# Patient Record
Sex: Female | Born: 1960 | Race: White | Hispanic: No | Marital: Married | Smoking: Never smoker
Health system: Southern US, Community
[De-identification: ages and names within clinical notes are randomized; demographics above are authoritative.]

## PROBLEM LIST (undated history)

## (undated) DIAGNOSIS — E280 Estrogen excess: Secondary | ICD-10-CM

## (undated) DIAGNOSIS — M069 Rheumatoid arthritis, unspecified: Secondary | ICD-10-CM

## (undated) DIAGNOSIS — C801 Malignant (primary) neoplasm, unspecified: Secondary | ICD-10-CM

## (undated) DIAGNOSIS — R4589 Other symptoms and signs involving emotional state: Secondary | ICD-10-CM

## (undated) DIAGNOSIS — N898 Other specified noninflammatory disorders of vagina: Secondary | ICD-10-CM

## (undated) HISTORY — DX: Other specified noninflammatory disorders of vagina: N89.8

## (undated) HISTORY — PX: NOVASURE ABLATION: SHX5394

## (undated) HISTORY — DX: Rheumatoid arthritis, unspecified: M06.9

## (undated) HISTORY — PX: LAPAROSCOPY: SHX197

## (undated) HISTORY — DX: Estrogen excess: E28.0

## (undated) HISTORY — PX: TONSILLECTOMY: SUR1361

## (undated) HISTORY — DX: Other symptoms and signs involving emotional state: R45.89

---

## 1998-04-06 ENCOUNTER — Other Ambulatory Visit: Admission: RE | Admit: 1998-04-06 | Discharge: 1998-04-06 | Payer: Self-pay | Admitting: Gynecology

## 1998-04-16 ENCOUNTER — Inpatient Hospital Stay (HOSPITAL_COMMUNITY): Admission: AD | Admit: 1998-04-16 | Discharge: 1998-04-19 | Payer: Self-pay | Admitting: Gynecology

## 1998-04-20 ENCOUNTER — Encounter: Admission: RE | Admit: 1998-04-20 | Discharge: 1998-07-19 | Payer: Self-pay | Admitting: *Deleted

## 1998-06-20 ENCOUNTER — Other Ambulatory Visit: Admission: RE | Admit: 1998-06-20 | Discharge: 1998-06-20 | Payer: Self-pay | Admitting: Obstetrics and Gynecology

## 1998-07-28 ENCOUNTER — Encounter (HOSPITAL_COMMUNITY): Admission: RE | Admit: 1998-07-28 | Discharge: 1998-10-26 | Payer: Self-pay | Admitting: *Deleted

## 2000-01-31 ENCOUNTER — Other Ambulatory Visit: Admission: RE | Admit: 2000-01-31 | Discharge: 2000-01-31 | Payer: Self-pay | Admitting: Gynecology

## 2004-10-30 ENCOUNTER — Ambulatory Visit: Payer: Self-pay

## 2005-11-22 ENCOUNTER — Ambulatory Visit: Payer: Self-pay

## 2005-12-04 ENCOUNTER — Ambulatory Visit: Payer: Self-pay

## 2006-04-09 ENCOUNTER — Ambulatory Visit: Payer: Self-pay | Admitting: Orthopaedic Surgery

## 2006-07-11 ENCOUNTER — Ambulatory Visit: Payer: Self-pay

## 2007-05-25 ENCOUNTER — Ambulatory Visit: Payer: Self-pay

## 2008-06-07 ENCOUNTER — Ambulatory Visit: Payer: Self-pay

## 2009-10-19 ENCOUNTER — Ambulatory Visit: Payer: Self-pay

## 2011-01-04 ENCOUNTER — Ambulatory Visit: Payer: Self-pay

## 2012-04-27 ENCOUNTER — Ambulatory Visit: Payer: Self-pay | Admitting: Gastroenterology

## 2012-05-05 ENCOUNTER — Ambulatory Visit: Payer: Self-pay | Admitting: Obstetrics and Gynecology

## 2013-10-22 ENCOUNTER — Ambulatory Visit: Payer: Self-pay

## 2014-03-25 DIAGNOSIS — K219 Gastro-esophageal reflux disease without esophagitis: Secondary | ICD-10-CM | POA: Insufficient documentation

## 2014-04-14 ENCOUNTER — Ambulatory Visit (INDEPENDENT_AMBULATORY_CARE_PROVIDER_SITE_OTHER): Payer: BC Managed Care – PPO

## 2014-04-14 ENCOUNTER — Encounter: Payer: Self-pay | Admitting: Podiatrist

## 2014-04-14 ENCOUNTER — Ambulatory Visit (INDEPENDENT_AMBULATORY_CARE_PROVIDER_SITE_OTHER): Payer: BC Managed Care – PPO | Admitting: Podiatrist

## 2014-04-14 VITALS — Resp 16 | Ht 64.0 in | Wt 126.0 lb

## 2014-04-14 DIAGNOSIS — M25879 Other specified joint disorders, unspecified ankle and foot: Secondary | ICD-10-CM

## 2014-04-14 DIAGNOSIS — M069 Rheumatoid arthritis, unspecified: Secondary | ICD-10-CM

## 2014-04-14 DIAGNOSIS — M79609 Pain in unspecified limb: Secondary | ICD-10-CM

## 2014-04-14 DIAGNOSIS — M79673 Pain in unspecified foot: Secondary | ICD-10-CM

## 2014-04-14 DIAGNOSIS — L600 Ingrowing nail: Secondary | ICD-10-CM

## 2014-04-14 NOTE — Patient Instructions (Signed)
Rheumatoid Arthritis Rheumatoid arthritis is a long-term (chronic) inflammatory disease that causes pain, swelling, and stiffness of the joints. It can affect the entire body, including the eyes and lungs. The effects of rheumatoid arthritis vary widely among those with the condition. CAUSES  The cause of rheumatoid arthritis is not known. It tends to run in families and is more common in women. Certain cells of the body's natural defense system (immune system) do not work properly and begin to attack healthy joints. It primarily involves the connective tissue that lines the joints (synovial membrane). This can cause damage to the joint. SYMPTOMS   Pain, stiffness, swelling, and decreased motion of many joints, especially in the hands and feet.  Stiffness that is worse in the morning. It may last 1 2 hours or longer.  Numbness and tingling in the hands.  Fatigue.  Loss of appetite.  Weight loss.  Low-grade fever.  Dry eyes and mouth.  Firm lumps (rheumatoid nodules) that grow beneath the skin in areas such as the elbows and hands. DIAGNOSIS  Diagnosis is based on the symptoms described, an exam, and blood tests. Sometimes, X-rays are helpful. TREATMENT  The goals of treatment are to relieve pain, reduce inflammation, and to slow down or stop joint damage and disability. Methods vary and may include:  Maintaining a balance of rest, exercise, and proper nutrition.  Medicines:  Pain relievers (analgesics).  Corticosteroids and nonsteroidal anti-inflammatory drugs (NSAIDs) to reduce inflammation.  Disease-modifying antirheumatic drugs (DMARDs) to try to slow the course of the disease.  Biologic response modifiers to reduce inflammation and damage.  Physical therapy and occupational therapy.  Surgery for patients with severe joint damage. Joint replacement or fusing of joints may be needed.  Routine monitoring and ongoing care, such as office visits, blood and urine tests, and  X-rays. HOME CARE INSTRUCTIONS   Remain physically active and reduce activity when the disease gets worse.  Eat a well-balanced diet.  Put heat on affected joints when you wake up and before activities. Keep the heat on the affected joint for as long as directed by your caregiver.  Put ice on affected joints following activities or exercising.  Put ice in a plastic bag.  Place a towel between your skin and the bag.  Leave the ice on for 15-20 minutes, 03-04 times a day.  Take all medicines and supplements as directed by your caregiver.  Use splints as directed by your caregiver. Splints help maintain joint position and function.  Do not sleep with pillows under your knees. This may lead to spasms.  Participate in a self-management program to keep current with the latest treatment and coping skills. SEEK IMMEDIATE MEDICAL CARE IF:  You have fainting episodes.  You have periods of extreme weakness.  You rapidly develop a hot, painful joint that is more severe than usual joint aches.  You have chills.  You have a fever. MAKE SURE YOU:  Understand these instructions.  Will watch your condition.  Will get help right away if you are not doing well or get worse. FOR MORE INFORMATION  American College of Rheumatology: www.rheumatology.[place]: www.arthritis.org Document Released: 12/06/2000 Document Revised: 06/09/2012 Document Reviewed: 01/15/2012 [facility] Patient Information 2014 [place], [place].

## 2014-04-14 NOTE — Progress Notes (Signed)
   Subjective:    Patient ID: Kendra Collier, female    DOB: [birth year], 53 y.o.   MRN: [id number]  HPI Comments: i have problems with both feet, its the bunion area, b/l 5th digits, my ankles and look at my toenails. [name] been having these problems for about 4 yrs. They have gotten worse over the years. i have had 3 cortisone injections by dr Precious Reel and wear different shoes. i went to see an orthopedic in [place] 2.5 yrs ago and he gave me injections for metatarsalitis.   Foot Pain Associated symptoms include fatigue.   Patient has also recently been diagnosed with Lyme disease and has not yet started treatment.  She has Enbrel for her RA and also has not started this medication due to the recent Lyme disease diagnosis   Review of Systems  Constitutional: Positive for fatigue.       Sweating Weight change   HENT: Negative.   Eyes: Positive for redness.  Respiratory: Negative.   Cardiovascular: Negative.   Gastrointestinal: Negative.   Endocrine: Positive for cold intolerance and heat intolerance.  Genitourinary: Positive for urgency and frequency.  Musculoskeletal:       Joint pain Back pain   Skin: Negative.   Allergic/Immunologic: Positive for environmental allergies.  Neurological: Negative.   Hematological: Negative.   Psychiatric/Behavioral: Negative.        Objective:   Physical Exam  GENERAL APPEARANCE: Alert, conversant. Appropriately groomed. No acute distress.  VASCULAR: Pedal pulses palpable at 2/4 DP and PT bilateral.  Capillary refill time is immediate to all digits,  Proximal to distal cooling it warm to warm.  Digital hair growth is present bilateral  NEUROLOGIC: sensation is intact epicritically and protectively to 5.07 monofilament at 5/5 sites bilateral.  Light touch is intact bilateral, vibratory sensation intact bilateral, achilles tendon reflex is intact bilateral.  MUSCULOSKELETAL:prominent metatarsals 1 and 5 bilateral with discomfort  laterally at the 5th.  Rectus alignment of digits is present.  No pre-dislocation noted at mpj's.   DERMATOLOGIC: prominent cyst is present lateral side of the 5th metatarsal left foot.  Otherwise skin color, texture, and turger are within normal limits.  No preulcerative lesions are seen, no interdigital maceration noted.  No open lesions present.  Digital nail on the right hallux has an ingrown medial nail border-- today is not bothersome.       Assessment & Plan:  Cyst of skin left 5th metatarsal head, rheumatoid arthritis, Plan:  Discussed treatment options-- xrays were reviewed and showed excellent appearance of joints and joint spacing.  Her 5th metatarsal head on the right has mild degredation laterally.  This is the only abnormal finding.  Discussed a custom orthotic to accomidate her feet if her good brooks shoes become uncomfortable.  Discussed otc gel pads for her dress shoes.  Discussed permanent removal of the medial border of the right hallux nail in the future.  Discussed drainage or removal of the cyst on the left foot if it becomes painful.  She will call if she would like any or all of the suggested treatments.

## 2014-04-28 ENCOUNTER — Ambulatory Visit: Payer: Self-pay | Admitting: Podiatrist

## 2014-07-07 ENCOUNTER — Ambulatory Visit (INDEPENDENT_AMBULATORY_CARE_PROVIDER_SITE_OTHER): Payer: BC Managed Care – PPO | Admitting: Podiatrist

## 2014-07-07 ENCOUNTER — Encounter: Payer: Self-pay | Admitting: Podiatrist

## 2014-07-07 VITALS — BP 137/86 | HR 90 | Resp 12

## 2014-07-07 DIAGNOSIS — M069 Rheumatoid arthritis, unspecified: Secondary | ICD-10-CM

## 2014-07-07 DIAGNOSIS — M722 Plantar fascial fibromatosis: Secondary | ICD-10-CM

## 2014-07-07 DIAGNOSIS — M25879 Other specified joint disorders, unspecified ankle and foot: Secondary | ICD-10-CM

## 2014-07-07 NOTE — Progress Notes (Signed)
Subjective: Patient presents today for continued foot pain. She is on multiple medications for her rheumatoid arthritis and her husband has multiple sclerosis and recently had a fall so she's been helping him out lately and has noticed her feet have become more and more uncomfortable. She has diffuse pain on the first metatarsophalangeal joint right and plantar fasciitis symptomatology bilateral. She's been stretching the feet wearing good supportive shoes and has had injections in the past which have failed to alleviate her pain. She is on multiple medications for the inflammation and wonders if there is anything else she can try. She's also developed a cyst on the left foot that she wants checked out  Objective: Neurovascular status is unchanged mild pain on palpation plantar medial aspect of bilateral heels and along the plantar fascia is noted. A very small skin cyst is present on the instep of the left foot it is not red, is not inflamed, it appears to be within the dermal layer and not within the plantar fascia. No swelling within the plantar fascia is palpated. Generalized tightening of the structures is noted. Diffuse pain at the first metatarsophalangeal joint right is also noted  Assessment: Rheumatoid arthritis with plantar fasciitis, skin cyst left instep, capsulitis first metatarsal phalangeal joint right  Plan: Because of the multiple medications that she start he on for inflammation and the fact that the anti-inflammatory injections have failed to relieve her discomfort in the past I recommended a course of physical therapy. Recommended iontophoresis to be employed as well as range of motion and stretching exercises and ultrasound. Discussed I can inject the cyst however it's very small and at this point I prefer to see if ultrasound can be of benefit she will see me back after the completion of physical therapy for recheck and possible injection therapy at that time.

## 2014-07-07 NOTE — Patient Instructions (Signed)

## 2014-11-04 ENCOUNTER — Ambulatory Visit: Payer: Self-pay | Admitting: Obstetrics and Gynecology

## 2015-02-15 ENCOUNTER — Ambulatory Visit: Payer: Self-pay | Admitting: Unknown Physician Specialty

## 2015-04-17 LAB — SURGICAL PATHOLOGY

## 2015-10-25 DIAGNOSIS — E559 Vitamin D deficiency, unspecified: Secondary | ICD-10-CM | POA: Insufficient documentation

## 2016-01-03 ENCOUNTER — Encounter: Payer: Self-pay | Admitting: *Deleted

## 2016-01-05 ENCOUNTER — Encounter: Payer: Self-pay | Admitting: Obstetrics and Gynecology

## 2016-01-05 ENCOUNTER — Ambulatory Visit (INDEPENDENT_AMBULATORY_CARE_PROVIDER_SITE_OTHER): Payer: BLUE CROSS/BLUE SHIELD | Admitting: Obstetrics and Gynecology

## 2016-01-05 VITALS — BP 121/70 | HR 82 | Ht 64.0 in | Wt 135.4 lb

## 2016-01-05 DIAGNOSIS — R300 Dysuria: Secondary | ICD-10-CM

## 2016-01-05 DIAGNOSIS — N926 Irregular menstruation, unspecified: Secondary | ICD-10-CM | POA: Diagnosis not present

## 2016-01-05 LAB — POCT URINALYSIS DIPSTICK
Blood, UA: NEGATIVE
GLUCOSE UA: NEGATIVE
Ketones, UA: NEGATIVE
LEUKOCYTES UA: NEGATIVE
Nitrite, UA: NEGATIVE
Protein, UA: NEGATIVE
SPEC GRAV UA: 1.015
Urobilinogen, UA: 0.2
pH, UA: 6

## 2016-01-05 NOTE — Patient Instructions (Signed)
Interstitial Cystitis Interstitial cystitis is a condition that causes inflammation of the bladder. The bladder is a hollow organ in the lower part of your abdomen. It stores urine after the urine is made by your kidneys. With interstitial cystitis, you may have pain in the bladder area. You may also have a frequent and urgent need to urinate. The severity of interstitial cystitis can vary from person to person. You may have flare-ups of the condition, and then it may go away for a while. For many people who have this condition, it becomes a long-term problem. CAUSES The cause of this condition is not known. RISK FACTORS This condition is more likely to develop in women. SYMPTOMS Symptoms of interstitial cystitis vary, and they can change over time. Symptoms may include:  Discomfort or pain in the bladder area. This can range from mild to severe. The pain may change in intensity as the bladder fills with urine or as it empties.  Pelvic pain.  An urgent need to urinate.  Frequent urination.  Pain during sexual intercourse.  Pinpoint bleeding on the bladder wall. For women, the symptoms often get worse during menstruation. DIAGNOSIS This condition is diagnosed by evaluating your symptoms and ruling out other causes. A physical exam will be done. Various tests may be done to rule out other conditions. Common tests include:  Urine tests.  Cystoscopy. In this test, a tool that is like a very thin telescope is used to look into your bladder.  Biopsy. This involves taking a sample of tissue from the bladder wall to be examined under a microscope. TREATMENT There is no cure for interstitial cystitis, but treatment methods are available to control your symptoms. Work closely with your health care provider to find the treatments that will be most effective for you. Treatment options may include:  Medicines to relieve pain and to help reduce the number of times that you feel the need to  urinate.  Bladder training. This involves learning ways to control when you urinate, such as:  Urinating at scheduled times.  Training yourself to delay urination.  Doing exercises (Kegel exercises) to strengthen the muscles that control urine flow.  Lifestyle changes, such as changing your diet or taking steps to control stress.  Use of a device that provides electrical stimulation in order to reduce pain.  A procedure that stretches your bladder by filling it with air or fluid.  Surgery. This is rare. It is only done for extreme cases if other treatments do not help. HOME CARE INSTRUCTIONS  Take medicines only as directed by your health care provider.  Use bladder training techniques as directed.  Keep a bladder diary to find out which foods, liquids, or activities make your symptoms worse.  Use your bladder diary to schedule bathroom trips. If you are away from home, plan to be near a bathroom at each of your scheduled times.  Make sure you urinate just before you leave the house and just before you go to bed.  Do Kegel exercises as directed by your health care provider.  Do not drink alcohol.  Do not use any tobacco products, including cigarettes, chewing tobacco, or electronic cigarettes. If you need help quitting, ask your health care provider.  Make dietary changes as directed by your health care provider. You may need to avoid spicy foods and foods that contain a high amount of potassium.  Limit your drinking of beverages that stimulate urination. These include soda, coffee, and tea.  Keep all follow-up   visits as directed by your health care provider. This is important. SEEK MEDICAL CARE IF:  Your symptoms do not get better after treatment.  Your pain and discomfort are getting worse.  You have more frequent urges to urinate.  You have a fever. SEEK IMMEDIATE MEDICAL CARE IF:  You are not able to control your bladder at all.   This information is not  intended to replace advice given to you by your health care provider. Make sure you discuss any questions you have with your health care provider.   Document Released: 08/09/2004 Document Revised: 12/30/2014 Document Reviewed: 08/16/2014 Elsevier Interactive Patient Education 2016 Elsevier Inc.  

## 2016-01-05 NOTE — Progress Notes (Signed)
Subjective:     Patient ID: Kendra Collier, female   DOB: 1961/05/16, 55 y.o.   MRN: [id number]  HPI Recurrent bladder pressure and urinary frequency with decreased urinary output, first noted last summer and more noticeable last few weeks, seeing Dr Sharol Roussel (naturalist physician in [place]) for RA and recurrent yeast.  Review of Systems See above Not sexually active due to spouses advancing MS No menses in 2 month     Objective:   Physical Exam A&O x4 well groomed female in no distress Pelvic exam: normal external genitalia, vulva, vagina, cervix, uterus and adnexa.    Assessment:     Urinary frequency and pelvic pressure RA Missed menses x 2 months     Plan:     Urine sent for culture interstitual cystitis discussed  RTC as needed.  Melody [place], CNM

## 2016-01-07 LAB — URINE CULTURE: Organism ID, Bacteria: NO GROWTH

## 2016-01-10 ENCOUNTER — Telehealth: Payer: Self-pay | Admitting: *Deleted

## 2016-01-10 NOTE — Telephone Encounter (Signed)
-----   Message from Joylene Igo, [place] sent at 01/09/2016  9:06 AM EST ----- Please let her know

## 2016-01-10 NOTE — Telephone Encounter (Signed)
Left pt detailed message about her labs

## 2016-05-14 ENCOUNTER — Encounter: Payer: Self-pay | Admitting: Obstetrics and Gynecology

## 2016-05-18 ENCOUNTER — Encounter: Payer: Self-pay | Admitting: Emergency Medicine

## 2016-05-18 ENCOUNTER — Emergency Department: Payer: BLUE CROSS/BLUE SHIELD

## 2016-05-18 ENCOUNTER — Emergency Department
Admission: EM | Admit: 2016-05-18 | Discharge: 2016-05-18 | Disposition: A | Discharge status: Home / Self Care | Payer: BLUE CROSS/BLUE SHIELD | Attending: Emergency Medicine | Admitting: Emergency Medicine

## 2016-05-18 DIAGNOSIS — Z791 Long term (current) use of non-steroidal anti-inflammatories (NSAID): Secondary | ICD-10-CM | POA: Diagnosis not present

## 2016-05-18 DIAGNOSIS — R0789 Other chest pain: Secondary | ICD-10-CM | POA: Insufficient documentation

## 2016-05-18 DIAGNOSIS — M069 Rheumatoid arthritis, unspecified: Secondary | ICD-10-CM | POA: Insufficient documentation

## 2016-05-18 DIAGNOSIS — R079 Chest pain, unspecified: Secondary | ICD-10-CM

## 2016-05-18 LAB — BASIC METABOLIC PANEL
Anion gap: 8 (ref 5–15)
BUN: 15 mg/dL (ref 6–20)
CHLORIDE: 107 mmol/L (ref 101–111)
CO2: 26 mmol/L (ref 22–32)
CREATININE: 0.61 mg/dL (ref 0.44–1.00)
Calcium: 8.9 mg/dL (ref 8.9–10.3)
GFR calc Af Amer: >60 mL/min (ref >60)
GFR calc non Af Amer: >60 mL/min (ref >60)
Glucose, Bld: 85 mg/dL (ref 65–99)
Potassium: 3.4 mmol/L — ABNORMAL LOW (ref 3.5–5.1)
SODIUM: 141 mmol/L (ref 135–145)

## 2016-05-18 LAB — CBC
HCT: 39.5 % (ref 35.0–47.0)
Hemoglobin: 13.5 g/dL (ref 12.0–16.0)
MCH: 31.7 pg (ref 26.0–34.0)
MCHC: 34.2 g/dL (ref 32.0–36.0)
MCV: 92.7 fL (ref 80.0–100.0)
PLATELETS: 222 10*3/uL (ref 150–440)
RBC: 4.26 MIL/uL (ref 3.80–5.20)
RDW: 12.4 % (ref 11.5–14.5)
WBC: 10.4 10*3/uL (ref 3.6–11.0)

## 2016-05-18 LAB — TROPONIN I: Troponin I: <0.03 ng/mL (ref <0.031)

## 2016-05-18 NOTE — Discharge Instructions (Signed)
You were evaluated for chest discomfort, and although no certain cause was found, and your exam and evaluation are reassuring emergency department today.  Return to emergency department for any new or worsening condition including worsening chest pain, trouble breathing, fever, confusion altered mental status, dizziness or passing out, or any other symptoms concerning to you.   Nonspecific Chest Pain It is often hard to find the cause of chest pain. There is always a chance that your pain could be related to something serious, such as a heart attack or a blood clot in your lungs. Chest pain can also be caused by conditions that are not life-threatening. If you have chest pain, it is very important to follow up with your doctor.  HOME CARE  If you were prescribed an antibiotic medicine, finish it all even if you start to feel better.  Avoid any activities that cause chest pain.  Do not use any tobacco products, including cigarettes, chewing tobacco, or electronic cigarettes. If you need help quitting, ask your doctor.  Do not drink alcohol.  Take medicines only as told by your doctor.  Keep all follow-up visits as told by your doctor. This is important. This includes any further testing if your chest pain does not go away.  Your doctor may tell you to keep your head raised (elevated) while you sleep.  Make lifestyle changes as told by your doctor. These may include:  Getting regular exercise. Ask your doctor to suggest some activities that are safe for you.  Eating a heart-healthy diet. Your doctor or a diet specialist (dietitian) can help you to learn healthy eating options.  Maintaining a healthy weight.  Managing diabetes, if necessary.  Reducing stress. GET HELP IF:  Your chest pain does not go away, even after treatment.  You have a rash with blisters on your chest.  You have a fever. GET HELP RIGHT AWAY IF:  Your chest pain is worse.  You have an increasing cough,  or you cough up blood.  You have severe belly (abdominal) pain.  You feel extremely weak.  You pass out (faint).  You have chills.  You have sudden, unexplained chest discomfort.  You have sudden, unexplained discomfort in your arms, back, neck, or jaw.  You have shortness of breath at any time.  You suddenly start to sweat, or your skin gets clammy.  You feel nauseous.  You vomit.  You suddenly feel light-headed or dizzy.  Your heart begins to beat quickly, or it feels like it is skipping beats. These symptoms may be an emergency. Do not wait to see if the symptoms will go away. Get medical help right away. Call your local emergency services (911 in the [place] Do not drive yourself to the hospital.   This information is not intended to replace advice given to you by your health care provider. Make sure you discuss any questions you have with your health care provider.   Document Released: 05/27/2008 Document Revised: 12/30/2014 Document Reviewed: 07/15/2014 Elsevier Interactive Patient Education [employer].

## 2016-05-18 NOTE — ED Notes (Signed)
Pt took 2 325mg  asa this am

## 2016-05-18 NOTE — ED Notes (Signed)
Central chest pain yesterday that was relieved with 2 asa; today has heaviness in her chest.

## 2016-05-18 NOTE — ED Provider Notes (Signed)
[facility] Emergency Department Provider Note   ____________________________________________  Time seen: Approximately 1 PM I have reviewed the triage vital signs and the triage nursing note.  HISTORY  Chief Complaint Chest Pain   Historian Patient  HPI Kendra Collier is a 55 y.o. female with a history of rheumatoid arthritis, is here for evaluation of central chest pain that she noticed yesterday morning, and took 650 of aspirin and the pain subsided over the course of the day. She woke up this morning with a chest heaviness/chest pressure. No trouble breathing or shortness breath. No palpations. No pleuritic chest pain. No overuse injury. She does report that she's been under a lot of stress lately with her father being treated for pancreatic cancer.  Some previous problems with indigestion, but this episode doesn't really seem all that GI related.    Past Medical History  Diagnosis Date  . Rheumatoid arthritis ([place])   . RA (rheumatoid arthritis) ([place])   . Vaginal dryness   . Estrogen excess   . Depressed affect     There are no active problems to display for this patient.   Past Surgical History  Procedure Laterality Date  . Tonsillectomy    . Novasure ablation    . Laparoscopy      Current Outpatient Rx  Name  Route  Sig  Dispense  Refill  . ALPRAZolam (XANAX) 0.25 MG tablet               . folic acid (FOLVITE) 1 MG tablet      Reported on 01/05/2016         . Ibuprofen (ADVIL PO)   Oral   Take by mouth as needed.         . leflunomide (ARAVA) 20 MG tablet   Oral   Take 20 mg by mouth daily.         . predniSONE (STERAPRED UNI-PAK) 5 MG TABS tablet               . Vitamin D, Ergocalciferol, (DRISDOL) 50000 UNITS CAPS capsule                 Allergies Sulfa antibiotics  History reviewed. No pertinent family history.  Social History Social History  Substance Use Topics  . Smoking status: Never Smoker    . Smokeless tobacco: None  . Alcohol Use: Yes     Comment: occas    Review of Systems  Constitutional: Negative for fever. Eyes: Negative for visual changes. ENT: Negative for sore throat. Cardiovascular: Chest pressure as per history of present illness. Respiratory: Negative for shortness of breath. Gastrointestinal: Negative for abdominal pain, vomiting and diarrhea. Genitourinary: Negative for dysuria. Musculoskeletal: Negative for back pain. Skin: Negative for rash. Neurological: Negative for headache. 10 point Review of Systems otherwise negative ____________________________________________   PHYSICAL EXAM:  VITAL SIGNS: ED Triage Vitals  Enc Vitals Group     BP 05/18/16 1233 131/64 mmHg     Pulse Rate 05/18/16 1233 78     Resp 05/18/16 1233 18     Temp 05/18/16 1233 98.2 F (36.8 C)     Temp Source 05/18/16 1233 Oral     SpO2 05/18/16 1233 100 %     Weight 05/18/16 1233 132 lb (59.875 kg)     Height 05/18/16 1233 5\' 4"  (1.626 m)     Head Cir --      Peak Flow --      Pain Score 05/18/16  1235 2     Pain Loc --      Pain Edu? --      Excl. in [place]? --      Constitutional: Alert and oriented. Well appearing and in no distress. HEENT   Head: Normocephalic and atraumatic.      Eyes: Conjunctivae are normal. PERRL. Normal extraocular movements.      Ears:         Nose: No congestion/rhinnorhea.   Mouth/Throat: Mucous membranes are moist.   Neck: No stridor. Cardiovascular/Chest: Normal rate, regular rhythm.  No murmurs, rubs, or gallops. Respiratory: Normal respiratory effort without tachypnea nor retractions. Breath sounds are clear and equal bilaterally. No wheezes/rales/rhonchi. Gastrointestinal: Soft. No distention, no guarding, no rebound. Nontender.   Genitourinary/rectal:Deferred Musculoskeletal: Nontender with normal range of motion in all extremities. No joint effusions.  No lower extremity tenderness.  No edema. Neurologic:  Normal speech  and language. No gross or focal neurologic deficits are appreciated. Skin:  Skin is warm, dry and intact. No rash noted. Psychiatric: Mood and affect are normal. Speech and behavior are normal. Patient exhibits appropriate insight and judgment.  ____________________________________________   EKG I, Lisa Roca, MD, the attending physician have personally viewed and interpreted all ECGs.  77 bpm. normal sinus rhythm. Narrow QRS. Normal axis. Normal ST and T-wave ____________________________________________  LABS (pertinent positives/negatives)  Labs Reviewed  BASIC METABOLIC PANEL - Abnormal; Notable for the following:    Potassium 3.4 (*)    All other components within normal limits  CBC  TROPONIN I    ____________________________________________  RADIOLOGY All Xrays were viewed by me. Imaging interpreted by Radiologist.  Chest two-view: No active cardiopulmonary disease. __________________________________________  PROCEDURES  Procedure(s) performed: None  Critical Care performed: None  ____________________________________________   ED COURSE / ASSESSMENT AND PLAN  Pertinent labs & imaging results that were available during my care of the patient were reviewed by me and considered in my medical decision making (see chart for details).   She is here with an atypical chest discomfort, and her examiner evaluation are reassuring today in emergency room.  Although she does have a history of rheumatoid arthritis, she does not have any prior history of coronary artery disease, or prior history of follow-up with a cardiologist.  I will go ahead and refer her for outpatient evaluation with on-call unassigned cardiologist, Dr. Humphrey Rolls.  I'm not highly suspicious for acute coronary syndrome, or acute pulmonary or vascular emergency.    CONSULTATIONS:   None  Patient / Family / Caregiver informed of clinical course, medical decision-making process, and agree with plan.    I discussed return precautions, follow-up instructions, and discharged instructions with patient and/or family.   ___________________________________________   FINAL CLINICAL IMPRESSION(S) / ED DIAGNOSES   Final diagnoses:  Chest pain, unspecified chest pain type              Note: This dictation was prepared with Dragon dictation. Any transcriptional errors that result from this process are unintentional   Lisa Roca, MD 05/18/16 [phone]

## 2016-11-06 DIAGNOSIS — Z8619 Personal history of other infectious and parasitic diseases: Secondary | ICD-10-CM | POA: Insufficient documentation

## 2017-03-14 ENCOUNTER — Telehealth: Payer: Self-pay | Admitting: *Deleted

## 2017-03-14 ENCOUNTER — Other Ambulatory Visit: Payer: BLUE CROSS/BLUE SHIELD

## 2017-03-14 NOTE — Telephone Encounter (Signed)
pls advise

## 2017-03-14 NOTE — Telephone Encounter (Signed)
Have her drop off a urine 

## 2017-03-14 NOTE — Telephone Encounter (Signed)
Patient called and stated that she still thinks she has a vaginal yeast infection. Patient states she used the 7 day over the counter treatment but it seem to have not worked well. She is having lower back pain, a wet sensation in her vaginal area , also an ammonia odor coming from her vaginal area as well. She states if she can be called in something to her pharmacy. Her pharmacy is [facility] on [address] Her contact number is [phone]. Please advise. Thank you .

## 2017-05-13 ENCOUNTER — Other Ambulatory Visit: Payer: Self-pay | Admitting: Obstetrics and Gynecology

## 2017-05-13 ENCOUNTER — Encounter: Payer: Self-pay | Admitting: Obstetrics and Gynecology

## 2017-05-13 ENCOUNTER — Ambulatory Visit (INDEPENDENT_AMBULATORY_CARE_PROVIDER_SITE_OTHER): Payer: BLUE CROSS/BLUE SHIELD | Admitting: Obstetrics and Gynecology

## 2017-05-13 VITALS — BP 115/74 | HR 73 | Ht 64.0 in | Wt 129.6 lb

## 2017-05-13 DIAGNOSIS — R232 Flushing: Secondary | ICD-10-CM | POA: Diagnosis not present

## 2017-05-13 DIAGNOSIS — Z01419 Encounter for gynecological examination (general) (routine) without abnormal findings: Secondary | ICD-10-CM | POA: Diagnosis not present

## 2017-05-13 DIAGNOSIS — E559 Vitamin D deficiency, unspecified: Secondary | ICD-10-CM | POA: Diagnosis not present

## 2017-05-13 DIAGNOSIS — M069 Rheumatoid arthritis, unspecified: Secondary | ICD-10-CM | POA: Insufficient documentation

## 2017-05-13 NOTE — Progress Notes (Signed)
Subjective:   Kendra Collier is a 56 y.o. [id number] Caucasian female here for a routine well-woman exam.  No LMP recorded. Patient has had an ablation.    Current complaints: daily hot flashes and skin changes for the last few months. PCP: Sabra Heck       does desire labs  Social History: Sexual: heterosexual Marital Status: married Living situation: with family Occupation: unknown occupation Tobacco/alcohol: no tobacco use Illicit drugs: no history of illicit drug use  The following portions of the patient's history were reviewed and updated as appropriate: allergies, current medications, past family history, past medical history, past social history, past surgical history and problem list.  Past Medical History Past Medical History:  Diagnosis Date  . Depressed affect   . Estrogen excess   . RA (rheumatoid arthritis) ([place])   . Rheumatoid arthritis ([place])   . Vaginal dryness     Past Surgical History Past Surgical History:  Procedure Laterality Date  . LAPAROSCOPY    . NOVASURE ABLATION    . TONSILLECTOMY      Gynecologic History [id number]  No LMP recorded. Patient has had an ablation. Contraception: tubal ligation Last Pap: ?Marland Kitchen Results were: normal Last mammogram: 2015. Results were: normal  Obstetric History OB History  Gravida Para Term Preterm AB Living  5       3 2   SAB TAB Ectopic Multiple Live Births    3     2    # Outcome Date GA Lbr Len/2nd Weight Sex Delivery Anes PTL Lv  5 Gravida 2001     Vag-Spont  Y LIV  4 Gravida 1999    M Vag-Spont   LIV  3 TAB           2 TAB           1 TAB               Current Medications Current Outpatient Prescriptions on File Prior to Visit  Medication Sig Dispense Refill  . ALPRAZolam (XANAX) 0.25 MG tablet     . folic acid (FOLVITE) 1 MG tablet Reported on 01/05/2016    . Vitamin D, Ergocalciferol, (DRISDOL) 50000 UNITS CAPS capsule     . Ibuprofen (ADVIL PO) Take by mouth as needed.     No current  facility-administered medications on file prior to visit.     Review of Systems Patient denies any headaches, blurred vision, shortness of breath, chest pain, abdominal pain, problems with bowel movements, urination, or intercourse.  Objective:  BP 115/74   Pulse 73   Ht 5\' 4"  (1.626 m)   Wt 129 lb 9.6 oz (58.8 kg)   BMI 22.25 kg/m  Physical Exam  General:  Well developed, well nourished, no acute distress. She is alert and oriented x3. Skin:  Warm and dry Neck:  Midline trachea, no thyromegaly or nodules Cardiovascular: Regular rate and rhythm, no murmur heard Lungs:  Effort normal, all lung fields clear to auscultation bilaterally Breasts:  No dominant palpable mass, retraction, or nipple discharge Abdomen:  Soft, non tender, no hepatosplenomegaly or masses Pelvic:  External genitalia is normal in appearance.  The vagina is normal in appearance. The cervix is bulbous, no CMT.  Thin prep pap is done with HR HPV cotesting. Uterus is felt to be normal size, shape, and contour.  No adnexal masses or tenderness noted. Extremities:  No swelling or varicosities noted Psych:  She has a normal mood and affect  Assessment:   Healthy  well-woman exam Vitamin d deficiency RA Hot flashes S/p ablation 7 years ago  Plan:  Labs obtained- will follow up accordingly Discussed common signs of menopause and treatment options/risk/side-effects. F/U 1 year for AE, or sooner if needed Mammogram ordered  Jocelyne Reinertsen Rockney Ghee, CNM

## 2017-05-14 LAB — COMPREHENSIVE METABOLIC PANEL
ALBUMIN: 4.6 g/dL (ref 3.5–5.5)
ALT: 18 IU/L (ref 0–32)
AST: 18 IU/L (ref 0–40)
Albumin/Globulin Ratio: 2 (ref 1.2–2.2)
Alkaline Phosphatase: 60 IU/L (ref 39–117)
BUN/Creatinine Ratio: 25 — ABNORMAL HIGH (ref 9–23)
BUN: 20 mg/dL (ref 6–24)
Bilirubin Total: 0.2 mg/dL (ref 0.0–1.2)
CALCIUM: 9.7 mg/dL (ref 8.7–10.2)
CHLORIDE: 104 mmol/L (ref 96–106)
CO2: 25 mmol/L (ref 18–29)
CREATININE: 0.79 mg/dL (ref 0.57–1.00)
GFR calc Af Amer: 99 mL/min/{1.73_m2} (ref >59)
GFR calc non Af Amer: 86 mL/min/{1.73_m2} (ref >59)
GLUCOSE: 80 mg/dL (ref 65–99)
Globulin, Total: 2.3 g/dL (ref 1.5–4.5)
Potassium: 4 mmol/L (ref 3.5–5.2)
Sodium: 145 mmol/L — ABNORMAL HIGH (ref 134–144)
Total Protein: 6.9 g/dL (ref 6.0–8.5)

## 2017-05-14 LAB — THYROID PANEL WITH TSH
FREE THYROXINE INDEX: 2.2 (ref 1.2–4.9)
T3 Uptake Ratio: 32 % (ref 24–39)
T4 TOTAL: 6.8 ug/dL (ref 4.5–12.0)
TSH: 2.04 u[IU]/mL (ref 0.450–4.500)

## 2017-05-14 LAB — PROGESTERONE: Progesterone: 0.3 ng/mL

## 2017-05-14 LAB — CBC
HEMOGLOBIN: 13.5 g/dL (ref 11.1–15.9)
Hematocrit: 40.8 % (ref 34.0–46.6)
MCH: 30.8 pg (ref 26.6–33.0)
MCHC: 33.1 g/dL (ref 31.5–35.7)
MCV: 93 fL (ref 79–97)
PLATELETS: 230 10*3/uL (ref 150–379)
RBC: 4.38 x10E6/uL (ref 3.77–5.28)
RDW: 13.2 % (ref 12.3–15.4)
WBC: 8.1 10*3/uL (ref 3.4–10.8)

## 2017-05-14 LAB — FSH/LH
FSH: 67 m[IU]/mL
LH: 35.9 m[IU]/mL

## 2017-05-14 LAB — LIPID PANEL
CHOL/HDL RATIO: 2.9 ratio (ref 0.0–4.4)
CHOLESTEROL TOTAL: 217 mg/dL — AB (ref 100–199)
HDL: 75 mg/dL (ref >39)
LDL CALC: 121 mg/dL — AB (ref 0–99)
Triglycerides: 107 mg/dL (ref 0–149)
VLDL CHOLESTEROL CAL: 21 mg/dL (ref 5–40)

## 2017-05-14 LAB — ESTRADIOL: Estradiol: <5 pg/mL

## 2017-05-14 LAB — VITAMIN D 25 HYDROXY (VIT D DEFICIENCY, FRACTURES): VIT D 25 HYDROXY: 43.4 ng/mL (ref 30.0–100.0)

## 2017-05-15 LAB — CYTOLOGY - PAP

## 2017-06-13 ENCOUNTER — Ambulatory Visit
Admission: RE | Admit: 2017-06-13 | Discharge: 2017-06-13 | Disposition: A | Discharge status: Home / Self Care | Payer: BLUE CROSS/BLUE SHIELD | Source: Ambulatory Visit | Attending: Obstetrics and Gynecology | Admitting: Obstetrics and Gynecology

## 2017-06-13 DIAGNOSIS — Z01419 Encounter for gynecological examination (general) (routine) without abnormal findings: Secondary | ICD-10-CM

## 2017-06-13 DIAGNOSIS — Z1231 Encounter for screening mammogram for malignant neoplasm of breast: Secondary | ICD-10-CM | POA: Diagnosis not present

## 2017-06-13 HISTORY — DX: Malignant (primary) neoplasm, unspecified: C80.1

## 2017-07-25 ENCOUNTER — Encounter: Payer: Self-pay | Admitting: Obstetrics and Gynecology

## 2017-08-27 ENCOUNTER — Ambulatory Visit (INDEPENDENT_AMBULATORY_CARE_PROVIDER_SITE_OTHER): Payer: BLUE CROSS/BLUE SHIELD | Admitting: Obstetrics and Gynecology

## 2017-08-27 ENCOUNTER — Encounter: Payer: Self-pay | Admitting: Obstetrics and Gynecology

## 2017-08-27 VITALS — BP 104/70 | HR 74 | Ht 64.0 in | Wt 128.1 lb

## 2017-08-27 DIAGNOSIS — Z79890 Hormone replacement therapy (postmenopausal): Secondary | ICD-10-CM | POA: Diagnosis not present

## 2017-08-27 MED ORDER — ESTRADIOL-NORETHINDRONE ACET 0.05-0.25 MG/DAY TD PTTW: 1.0000 | MEDICATED_PATCH | TRANSDERMAL | 12 refills | Status: AC

## 2017-08-27 NOTE — Progress Notes (Signed)
Subjective:     Patient ID: Kendra Collier, female   DOB: 10-31-1961, 56 y.o.   MRN: [id number]  HPI Seen for AE in May and we discussed HRT at that time.  Still having frequent night sweats and hot flashes. Doesn't sleep well. And skin and hair changes associated with menopause. Feels like brain fogginess.    Review of Systems Negative except stated above in HPI.    Objective:   Physical Exam   A&Ox4 Well groomed female in no distress Blood pressure 104/70, pulse 74, height 5\' 4"  (1.626 m), weight 128 lb 1.6 oz (58.1 kg). PE not indicated  Assessment:     Postmenopausal HRT-new start RA    Plan:     Counseled on all forms of HRT and desires trial of combi-patch. Instructed on use, side effects, contraindications, and expected outcomes.  RTC as needed. Will message me on MyChart in a few weeks to let me know how she is feeling.  >50% of 15 minute visit spent in counseling on above.  Melody Mannford, CNM

## 2017-09-22 ENCOUNTER — Other Ambulatory Visit: Payer: Self-pay | Admitting: Orthopedic Surgery

## 2017-09-22 DIAGNOSIS — M7502 Adhesive capsulitis of left shoulder: Secondary | ICD-10-CM

## 2017-09-25 ENCOUNTER — Ambulatory Visit: Admission: RE | Admit: 2017-09-25 | Payer: BLUE CROSS/BLUE SHIELD | Source: Ambulatory Visit

## 2017-10-24 ENCOUNTER — Ambulatory Visit
Admission: RE | Admit: 2017-10-24 | Discharge: 2017-10-24 | Disposition: A | Discharge status: Home / Self Care | Payer: BLUE CROSS/BLUE SHIELD | Source: Ambulatory Visit | Attending: Orthopedic Surgery | Admitting: Orthopedic Surgery

## 2017-10-24 DIAGNOSIS — M7502 Adhesive capsulitis of left shoulder: Secondary | ICD-10-CM

## 2017-10-24 MED ORDER — METHYLPREDNISOLONE ACETATE 80 MG/ML IJ SUSP
INTRAMUSCULAR | Status: AC
  Filled 2017-10-24: qty 1

## 2017-10-24 MED ORDER — METHYLPREDNISOLONE ACETATE 40 MG/ML INJ SUSP (RADIOLOG: 80.0000 mg | Freq: Once | INTRAMUSCULAR | Status: DC

## 2017-10-24 MED ORDER — IOPAMIDOL (ISOVUE-200) INJECTION 41%
15.0000 mL | Freq: Once | INTRAVENOUS | Status: AC | PRN
  Administered 2017-10-24: 15 mL
  Filled 2017-10-24: qty 50

## 2017-10-24 MED ORDER — LIDOCAINE HCL (PF) 1 % IJ SOLN
5.0000 mL | Freq: Once | INTRAMUSCULAR | Status: DC
  Filled 2017-10-24: qty 5

## 2017-10-24 MED ORDER — BUPIVACAINE HCL (PF) 0.25 % IJ SOLN
INTRAMUSCULAR | Status: AC
  Filled 2017-10-24: qty 30

## 2017-10-24 MED ORDER — BUPIVACAINE HCL (PF) 0.25 % IJ SOLN
7.0000 mL | Freq: Once | INTRAMUSCULAR | Status: DC
  Filled 2017-10-24: qty 10

## 2018-01-29 ENCOUNTER — Telehealth: Payer: Self-pay | Admitting: *Deleted

## 2018-01-29 NOTE — Telephone Encounter (Signed)
Patient states that she was on an Antibiotic for a month and she is experiencing vaginal itching, burning, dryness . Patient wants advise on what to take. Patient is requesting a call back . Her contact # is [phone]. Please advise. Thank you

## 2018-01-30 ENCOUNTER — Other Ambulatory Visit: Payer: Self-pay | Admitting: *Deleted

## 2018-01-30 NOTE — Telephone Encounter (Signed)
Spoke with pt

## 2018-05-19 ENCOUNTER — Encounter: Payer: Self-pay | Admitting: *Deleted

## 2018-05-19 ENCOUNTER — Encounter: Payer: BLUE CROSS/BLUE SHIELD | Admitting: Obstetrics and Gynecology

## 2018-05-21 ENCOUNTER — Encounter: Payer: Self-pay | Admitting: Obstetrics and Gynecology

## 2018-05-22 ENCOUNTER — Encounter: Payer: Self-pay | Admitting: Obstetrics and Gynecology

## 2018-05-22 ENCOUNTER — Ambulatory Visit (INDEPENDENT_AMBULATORY_CARE_PROVIDER_SITE_OTHER): Payer: BLUE CROSS/BLUE SHIELD | Admitting: Obstetrics and Gynecology

## 2018-05-22 VITALS — BP 99/68 | HR 74 | Ht 64.0 in | Wt 133.9 lb

## 2018-05-22 DIAGNOSIS — Z01419 Encounter for gynecological examination (general) (routine) without abnormal findings: Secondary | ICD-10-CM

## 2018-05-22 NOTE — Progress Notes (Signed)
Subjective:   Kendra Collier is a 57 y.o. [id number] Caucasian female here for a routine well-woman exam.  No LMP recorded. Patient has had an ablation.    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  Past Medical History Past Medical History:  Diagnosis Date  . Cancer ([place])    skin  . Depressed affect   . Estrogen excess   . RA (rheumatoid arthritis) ([place])   . Rheumatoid arthritis ([place])   . Vaginal dryness     Past Surgical History Past Surgical History:  Procedure Laterality Date  . LAPAROSCOPY    . NOVASURE ABLATION    . TONSILLECTOMY      Gynecologic History [id number]  No LMP recorded. Patient has had an ablation. Contraception: post menopausal status Last Pap: 2018. Results were: normal Last mammogram: 05/2017. Results were: normal   Obstetric History OB History  Gravida Para Term Preterm AB Living  5       3 2   SAB TAB Ectopic Multiple Live Births    3     2    # Outcome Date GA Lbr Len/2nd Weight Sex Delivery Anes PTL Lv  5 Gravida 2001     Vag-Spont  Y LIV  4 Gravida 1999    M Vag-Spont   LIV  3 TAB           2 TAB           1 TAB             Current Medications Current Outpatient Medications on File Prior to Visit  Medication Sig Dispense Refill  . Adalimumab (HUMIRA) 40 MG/0.4ML PSKT Inject into the skin.    Marland Kitchen ALPRAZolam (XANAX) 0.25 MG tablet     . buPROPion (WELLBUTRIN XL) 150 MG 24 hr tablet Take 150 mg by mouth daily.    . cyanocobalamin 1000 MCG tablet Take 1,000 mcg by mouth daily.     . folic acid (FOLVITE) 1 MG tablet Reported on 01/05/2016    . Ibuprofen (ADVIL PO) Take by mouth as needed.    . Vitamin D, Ergocalciferol, (DRISDOL) 50000 UNITS CAPS capsule     . estradiol-norethindrone (COMBIPATCH) 0.05-0.25 MG/DAY Place 1 patch onto the skin 2 (two) times a week. (Patient not taking: Reported on 05/22/2018) 8 patch 12   No current facility-administered medications on file prior to visit.     Review of Systems Patient denies any headaches, blurred vision, shortness of breath, chest pain, abdominal pain, problems with bowel movements, urination, or intercourse.  Objective:  BP 99/68   Pulse 74   Ht 5\' 4"  (1.626 m)   Wt 133 lb 14.4 oz (60.7 kg)   BMI 22.98 kg/m  Physical Exam  General:  Well developed, well nourished, no acute distress. She is alert and oriented x3. Skin:  Warm and dry Neck:  Midline trachea, no thyromegaly or nodules Cardiovascular: Regular rate and rhythm, no murmur heard Lungs:  Effort normal, all lung fields clear to auscultation bilaterally Breasts:  No dominant palpable mass, retraction, or nipple discharge Abdomen:  Soft, non  tender, no hepatosplenomegaly or masses Pelvic:  External genitalia is normal in appearance.  The vagina is normal in appearance. The cervix is bulbous, no CMT.  Thin prep pap is not done. Uterus is felt to be normal size, shape, and contour.  No adnexal masses or tenderness noted. Extremities:  No swelling or varicosities noted Psych:  She has a normal mood and affect  Assessment:   Healthy well-woman exam S/p menopause  Plan:   F/U 1 year for AE, or sooner if needed Mammogram ordered or sooner if problems Colonoscopy due next year  Maris Abascal Rockney Ghee, [place]

## 2018-08-19 ENCOUNTER — Ambulatory Visit
Admission: RE | Admit: 2018-08-19 | Discharge: 2018-08-19 | Disposition: A | Discharge status: Home / Self Care | Payer: BLUE CROSS/BLUE SHIELD | Source: Ambulatory Visit | Attending: Obstetrics and Gynecology | Admitting: Obstetrics and Gynecology

## 2018-08-19 DIAGNOSIS — Z01419 Encounter for gynecological examination (general) (routine) without abnormal findings: Secondary | ICD-10-CM | POA: Diagnosis present

## 2019-05-27 ENCOUNTER — Encounter: Payer: Self-pay | Admitting: *Deleted

## 2019-05-28 ENCOUNTER — Ambulatory Visit (INDEPENDENT_AMBULATORY_CARE_PROVIDER_SITE_OTHER): Payer: PRIVATE HEALTH INSURANCE | Admitting: Obstetrics and Gynecology

## 2019-05-28 ENCOUNTER — Other Ambulatory Visit: Payer: Self-pay

## 2019-05-28 ENCOUNTER — Encounter: Payer: Self-pay | Admitting: Obstetrics and Gynecology

## 2019-05-28 VITALS — BP 85/46 | HR 98 | Ht 64.0 in | Wt 137.3 lb

## 2019-05-28 DIAGNOSIS — Z01419 Encounter for gynecological examination (general) (routine) without abnormal findings: Secondary | ICD-10-CM

## 2019-05-28 NOTE — Progress Notes (Signed)
Subjective:   Kendra Collier is a 58 y.o. [id number] Caucasian female here for a routine well-woman exam.  No LMP recorded. Patient has had an ablation.    Current complaints: vaginal dryness, coconut oil helps. Not sexually active in years as spouse is disabled. Desires genetic cancer screening due to father and maternal aunts cancer history. 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 Tobacco/alcohol: no tobacco use Illicit drugs: no history of illicit drug use  The following portions of the patient's history were reviewed and updated as appropriate: allergies, current medications, past family history, past medical history, past social history, past surgical history and problem list.  Past Medical History Past Medical History:  Diagnosis Date  . Cancer ([place])    skin  . Depressed affect   . Estrogen excess   . RA (rheumatoid arthritis) ([place])   . Rheumatoid arthritis ([place])   . Vaginal dryness     Past Surgical History Past Surgical History:  Procedure Laterality Date  . LAPAROSCOPY    . NOVASURE ABLATION    . TONSILLECTOMY      Gynecologic History [id number]  No LMP recorded. Patient has had an ablation. Contraception: post menopausal status Last Pap: 2018. Results were: normal Last mammogram: 08/2018. Results were: normal   Obstetric History OB History  Gravida Para Term Preterm AB Living  5       3 2   SAB TAB Ectopic Multiple Live Births    3     2    # Outcome Date GA Lbr Len/2nd Weight Sex Delivery Anes PTL Lv  5 Gravida 2001     Vag-Spont  Y LIV  4 Gravida 1999    M Vag-Spont   LIV  3 TAB           2 TAB           1 TAB             Current Medications Current Outpatient Medications on File Prior to Visit  Medication Sig Dispense Refill  . Adalimumab (HUMIRA) 40 MG/0.4ML PSKT Inject into the skin.    Marland Kitchen ALPRAZolam  (XANAX) 0.25 MG tablet     . buPROPion (WELLBUTRIN XL) 150 MG 24 hr tablet Take 150 mg by mouth daily.    . cyanocobalamin 1000 MCG tablet Take 1,000 mcg by mouth daily.    . folic acid (FOLVITE) 1 MG tablet Reported on 01/05/2016    . Vitamin D, Ergocalciferol, (DRISDOL) 50000 UNITS CAPS capsule     . estradiol-norethindrone (COMBIPATCH) 0.05-0.25 MG/DAY Place 1 patch onto the skin 2 (two) times a week. (Patient not taking: Reported on 05/22/2018) 8 patch 12  . Ibuprofen (ADVIL PO) Take by mouth as needed.     No current facility-administered medications on file prior to visit.     Review of Systems Patient denies any headaches, blurred vision, shortness of breath, chest pain, abdominal pain, problems with bowel movements, urination, or intercourse.  Objective:  BP (!) 85/46   Pulse 98   Ht 5\' 4"  (1.626 m)   Wt 137 lb 4.8 oz (62.3 kg)   BMI 23.57 kg/m  Physical Exam  General:  Well developed, well nourished, no acute distress. She is alert and oriented x3. Skin:  Warm and dry Neck:  Midline trachea, no thyromegaly or nodules Cardiovascular: Regular rate and rhythm, no  murmur heard Lungs:  Effort normal, all lung fields clear to auscultation bilaterally Breasts:  No dominant palpable mass, retraction, or nipple discharge Abdomen:  Soft, non tender, no hepatosplenomegaly or masses Pelvic:  External genitalia is normal in appearance.  The vagina is normal in appearance. The cervix is bulbous, no CMT.  Thin prep pap is not done . Uterus is felt to be normal size, shape, and contour.  No adnexal masses or tenderness noted. Extremities:  No swelling or varicosities noted Psych:  She has a normal mood and affect  Assessment:   Healthy well-woman exam RA Family h/o pancreatic, liver and breast cancer  Plan:  Labs done- invitea added- will follow up accordingly F/U 1 year for AE, or sooner if needed Mammogram ordered  Sri Clegg Rockney Ghee, CNM

## 2019-05-29 LAB — CBC
Hematocrit: 39.7 % (ref 34.0–46.6)
Hemoglobin: 13.5 g/dL (ref 11.1–15.9)
MCH: 31 pg (ref 26.6–33.0)
MCHC: 34 g/dL (ref 31.5–35.7)
MCV: 91 fL (ref 79–97)
Platelets: 228 10*3/uL (ref 150–450)
RBC: 4.36 x10E6/uL (ref 3.77–5.28)
RDW: 11.9 % (ref 11.7–15.4)
WBC: 7.8 10*3/uL (ref 3.4–10.8)

## 2019-05-29 LAB — COMPREHENSIVE METABOLIC PANEL
ALT: 11 IU/L (ref 0–32)
AST: 18 IU/L (ref 0–40)
Albumin/Globulin Ratio: 2.1 (ref 1.2–2.2)
Albumin: 4.5 g/dL (ref 3.8–4.9)
Alkaline Phosphatase: 61 IU/L (ref 39–117)
BUN/Creatinine Ratio: 19 (ref 9–23)
BUN: 12 mg/dL (ref 6–24)
Bilirubin Total: 0.4 mg/dL (ref 0.0–1.2)
CO2: 25 mmol/L (ref 20–29)
Calcium: 9.3 mg/dL (ref 8.7–10.2)
Chloride: 102 mmol/L (ref 96–106)
Creatinine, Ser: 0.63 mg/dL (ref 0.57–1.00)
GFR calc Af Amer: 117 mL/min/{1.73_m2} (ref >59)
GFR calc non Af Amer: 101 mL/min/{1.73_m2} (ref >59)
Globulin, Total: 2.1 g/dL (ref 1.5–4.5)
Glucose: 77 mg/dL (ref 65–99)
Potassium: 4 mmol/L (ref 3.5–5.2)
Sodium: 141 mmol/L (ref 134–144)
Total Protein: 6.6 g/dL (ref 6.0–8.5)

## 2019-07-21 ENCOUNTER — Telehealth: Payer: Self-pay | Admitting: Obstetrics and Gynecology

## 2019-07-21 NOTE — Telephone Encounter (Signed)
Pt called and stated that she missed a call from Melody to go over results. Spoke with [facility], Nurse confirmed she will relay message to return call. Thank you.

## 2019-07-21 NOTE — Telephone Encounter (Signed)
pls advise

## 2019-07-22 ENCOUNTER — Other Ambulatory Visit: Payer: Self-pay | Admitting: Obstetrics and Gynecology

## 2019-07-22 DIAGNOSIS — Z1501 Genetic susceptibility to malignant neoplasm of breast: Secondary | ICD-10-CM

## 2019-08-17 ENCOUNTER — Other Ambulatory Visit: Payer: Self-pay | Admitting: Obstetrics and Gynecology

## 2019-08-17 DIAGNOSIS — Z1231 Encounter for screening mammogram for malignant neoplasm of breast: Secondary | ICD-10-CM

## 2019-09-21 ENCOUNTER — Ambulatory Visit
Admission: RE | Admit: 2019-09-21 | Discharge: 2019-09-21 | Disposition: A | Discharge status: Home / Self Care | Payer: PRIVATE HEALTH INSURANCE | Source: Ambulatory Visit | Attending: Obstetrics and Gynecology | Admitting: Obstetrics and Gynecology

## 2019-09-21 DIAGNOSIS — Z1231 Encounter for screening mammogram for malignant neoplasm of breast: Secondary | ICD-10-CM | POA: Diagnosis not present

## 2019-09-22 ENCOUNTER — Other Ambulatory Visit: Payer: Self-pay | Admitting: Obstetrics and Gynecology

## 2019-09-22 DIAGNOSIS — R928 Other abnormal and inconclusive findings on diagnostic imaging of breast: Secondary | ICD-10-CM

## 2019-09-22 DIAGNOSIS — N6489 Other specified disorders of breast: Secondary | ICD-10-CM

## 2019-10-04 ENCOUNTER — Ambulatory Visit
Admission: RE | Admit: 2019-10-04 | Discharge: 2019-10-04 | Disposition: A | Discharge status: Home / Self Care | Payer: PRIVATE HEALTH INSURANCE | Source: Ambulatory Visit | Attending: Obstetrics and Gynecology | Admitting: Obstetrics and Gynecology

## 2019-10-04 DIAGNOSIS — R928 Other abnormal and inconclusive findings on diagnostic imaging of breast: Secondary | ICD-10-CM | POA: Insufficient documentation

## 2019-10-04 DIAGNOSIS — N6489 Other specified disorders of breast: Secondary | ICD-10-CM | POA: Diagnosis present

## 2019-11-24 ENCOUNTER — Telehealth: Payer: Self-pay

## 2019-11-24 NOTE — Telephone Encounter (Signed)
Pt called stating per discussion at last visit pt needs to fu on genetics testing previously done that resulted positive for risk of ovarian cancer. Pt states provider was to schedule appt with genetics counselor. GIVEN APPT ANNIE 12/15/19.  Also pt states orders for repeat mammogram are not honored because [facility] is no longer here. Pt needs by end of year. Also pt states order pelvic ultrasound due to +ovarian ca risk on genetics test. PT STATES NEEDS ALL VISITS SCHEDULED BY END OF 2020.  OK to wait until GYN appt Deneise Lever 12/15/19.?

## 2019-11-29 ENCOUNTER — Telehealth: Payer: Self-pay

## 2019-11-29 ENCOUNTER — Other Ambulatory Visit: Payer: Self-pay | Admitting: Certified Nurse Midwife

## 2019-11-29 DIAGNOSIS — Z1273 Encounter for screening for malignant neoplasm of ovary: Secondary | ICD-10-CM

## 2019-11-29 DIAGNOSIS — Z1231 Encounter for screening mammogram for malignant neoplasm of breast: Secondary | ICD-10-CM

## 2019-11-29 NOTE — Telephone Encounter (Signed)
This pt needs to reach out to the lab for genetic counseling. I do not schedule it, she has to. The information should be on the test results. I do not need to see this pt for that. I am not a Dietitian. I put in the order for the mammogram and the pelvic u/s .  She needs to call and schedule those appointment as well. Please let her know.   Thanks,  Philip Aspen, CNM

## 2019-11-29 NOTE — Progress Notes (Signed)
Orders placed for pelvic u/s due to history postive genetic screening for ovarian cancer. Order placed for mammogram. Pt states the one Melody placed no longer valid since Melody is gone. Order placed.   Philip Aspen, CNM

## 2019-11-29 NOTE — Telephone Encounter (Signed)
mychart message sent- patient may call and setup appointment for genetic counseling. Orders for mammogram and U/S have been placed- she may call and schedule.

## 2019-12-07 ENCOUNTER — Ambulatory Visit (INDEPENDENT_AMBULATORY_CARE_PROVIDER_SITE_OTHER): Payer: PRIVATE HEALTH INSURANCE

## 2019-12-07 ENCOUNTER — Other Ambulatory Visit: Payer: Self-pay

## 2019-12-07 DIAGNOSIS — Z1273 Encounter for screening for malignant neoplasm of ovary: Secondary | ICD-10-CM | POA: Diagnosis not present

## 2019-12-07 DIAGNOSIS — D250 Submucous leiomyoma of uterus: Secondary | ICD-10-CM

## 2019-12-14 ENCOUNTER — Telehealth: Payer: Self-pay

## 2019-12-15 ENCOUNTER — Encounter: Payer: PRIVATE HEALTH INSURANCE | Admitting: Certified Nurse Midwife

## 2021-04-11 ENCOUNTER — Other Ambulatory Visit: Payer: Self-pay | Admitting: Obstetrics and Gynecology

## 2021-04-11 ENCOUNTER — Other Ambulatory Visit: Payer: Self-pay | Admitting: Internal Medicine

## 2021-04-11 DIAGNOSIS — Z1231 Encounter for screening mammogram for malignant neoplasm of breast: Secondary | ICD-10-CM

## 2021-04-23 ENCOUNTER — Other Ambulatory Visit: Payer: Self-pay

## 2021-04-23 ENCOUNTER — Ambulatory Visit
Admission: RE | Admit: 2021-04-23 | Discharge: 2021-04-23 | Disposition: A | Discharge status: Home / Self Care | Payer: 59 | Source: Ambulatory Visit | Attending: Obstetrics and Gynecology | Admitting: Obstetrics and Gynecology

## 2021-04-23 DIAGNOSIS — Z1231 Encounter for screening mammogram for malignant neoplasm of breast: Secondary | ICD-10-CM | POA: Insufficient documentation

## 2022-01-07 DIAGNOSIS — M7062 Trochanteric bursitis, left hip: Secondary | ICD-10-CM | POA: Diagnosis not present

## 2022-01-07 DIAGNOSIS — M0579 Rheumatoid arthritis with rheumatoid factor of multiple sites without organ or systems involvement: Secondary | ICD-10-CM | POA: Diagnosis not present

## 2022-01-10 DIAGNOSIS — Z20822 Contact with and (suspected) exposure to covid-19: Secondary | ICD-10-CM | POA: Diagnosis not present

## 2022-01-10 DIAGNOSIS — Z03818 Encounter for observation for suspected exposure to other biological agents ruled out: Secondary | ICD-10-CM | POA: Diagnosis not present

## 2022-01-25 DIAGNOSIS — Z03818 Encounter for observation for suspected exposure to other biological agents ruled out: Secondary | ICD-10-CM | POA: Diagnosis not present

## 2022-01-25 DIAGNOSIS — Z20822 Contact with and (suspected) exposure to covid-19: Secondary | ICD-10-CM | POA: Diagnosis not present

## 2022-05-02 DIAGNOSIS — L03031 Cellulitis of right toe: Secondary | ICD-10-CM | POA: Diagnosis not present

## 2022-05-02 DIAGNOSIS — Z8739 Personal history of other diseases of the musculoskeletal system and connective tissue: Secondary | ICD-10-CM | POA: Diagnosis not present

## 2022-05-08 DIAGNOSIS — M06842 Other specified rheumatoid arthritis, left hand: Secondary | ICD-10-CM | POA: Diagnosis not present

## 2022-05-08 DIAGNOSIS — M19041 Primary osteoarthritis, right hand: Secondary | ICD-10-CM | POA: Diagnosis not present

## 2022-05-08 DIAGNOSIS — M0579 Rheumatoid arthritis with rheumatoid factor of multiple sites without organ or systems involvement: Secondary | ICD-10-CM | POA: Diagnosis not present

## 2022-05-08 DIAGNOSIS — E559 Vitamin D deficiency, unspecified: Secondary | ICD-10-CM | POA: Diagnosis not present

## 2022-05-08 DIAGNOSIS — Z79899 Other long term (current) drug therapy: Secondary | ICD-10-CM | POA: Diagnosis not present

## 2022-05-08 DIAGNOSIS — M06841 Other specified rheumatoid arthritis, right hand: Secondary | ICD-10-CM | POA: Diagnosis not present

## 2022-05-08 DIAGNOSIS — M19072 Primary osteoarthritis, left ankle and foot: Secondary | ICD-10-CM | POA: Diagnosis not present

## 2022-05-08 DIAGNOSIS — M069 Rheumatoid arthritis, unspecified: Secondary | ICD-10-CM | POA: Diagnosis not present

## 2022-07-02 DIAGNOSIS — M0579 Rheumatoid arthritis with rheumatoid factor of multiple sites without organ or systems involvement: Secondary | ICD-10-CM | POA: Diagnosis not present

## 2022-07-02 DIAGNOSIS — U071 COVID-19: Secondary | ICD-10-CM | POA: Diagnosis not present

## 2022-08-14 DIAGNOSIS — M0579 Rheumatoid arthritis with rheumatoid factor of multiple sites without organ or systems involvement: Secondary | ICD-10-CM | POA: Diagnosis not present

## 2022-08-14 DIAGNOSIS — Z79899 Other long term (current) drug therapy: Secondary | ICD-10-CM | POA: Diagnosis not present

## 2022-08-16 ENCOUNTER — Other Ambulatory Visit: Payer: Self-pay | Admitting: Obstetrics and Gynecology

## 2022-08-16 DIAGNOSIS — Z1231 Encounter for screening mammogram for malignant neoplasm of breast: Secondary | ICD-10-CM

## 2022-08-21 ENCOUNTER — Ambulatory Visit
Admission: RE | Admit: 2022-08-21 | Discharge: 2022-08-21 | Disposition: A | Discharge status: Home / Self Care | Payer: 59 | Source: Ambulatory Visit | Attending: Obstetrics and Gynecology | Admitting: Obstetrics and Gynecology

## 2022-08-21 DIAGNOSIS — Z1231 Encounter for screening mammogram for malignant neoplasm of breast: Secondary | ICD-10-CM | POA: Diagnosis not present

## 2022-08-30 DIAGNOSIS — N951 Menopausal and female climacteric states: Secondary | ICD-10-CM | POA: Diagnosis not present

## 2023-03-08 IMAGING — MG MM DIGITAL SCREENING BILAT W/ TOMO AND CAD
8 series · 8 of 24 positions shown · non-contrast
Comparison: Previous exam(s).

CLINICAL DATA: Screening.

EXAM:
DIGITAL SCREENING BILATERAL MAMMOGRAM WITH TOMOSYNTHESIS AND CAD
TECHNIQUE: Bilateral screening digital craniocaudal and mediolateral oblique
mammograms were obtained. Bilateral screening digital breast
tomosynthesis was performed. The images were evaluated with
computer-aided detection.

[R MLO synth-2D]
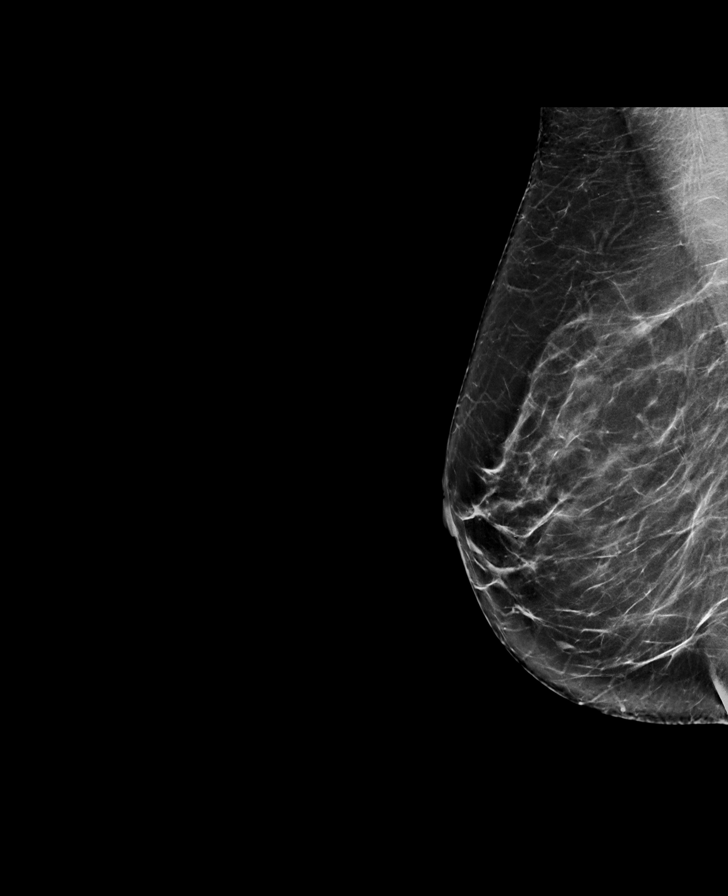

[L CC synth-2D]
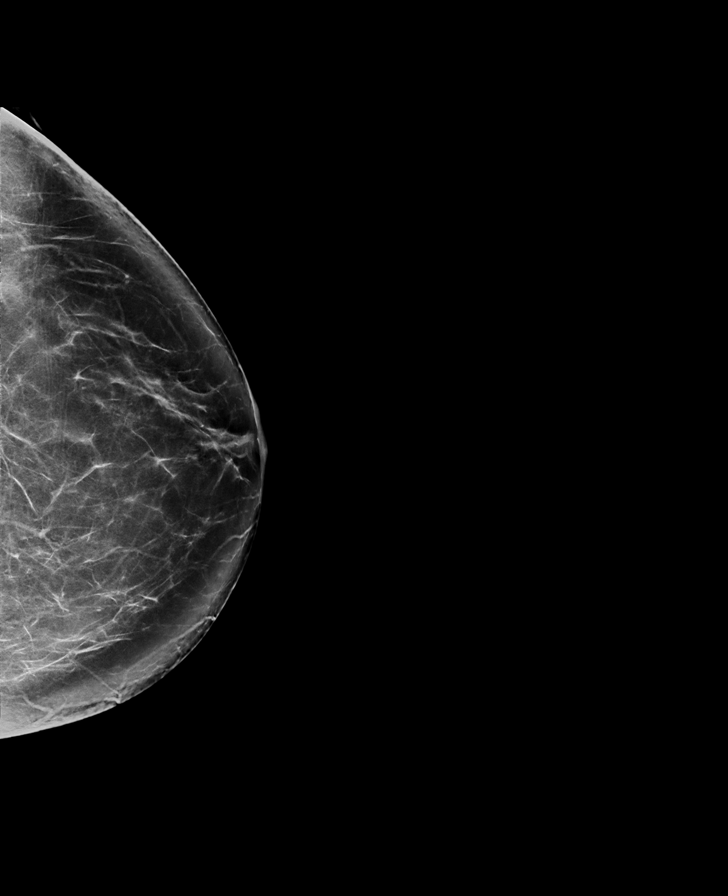

[R CC synth-2D]
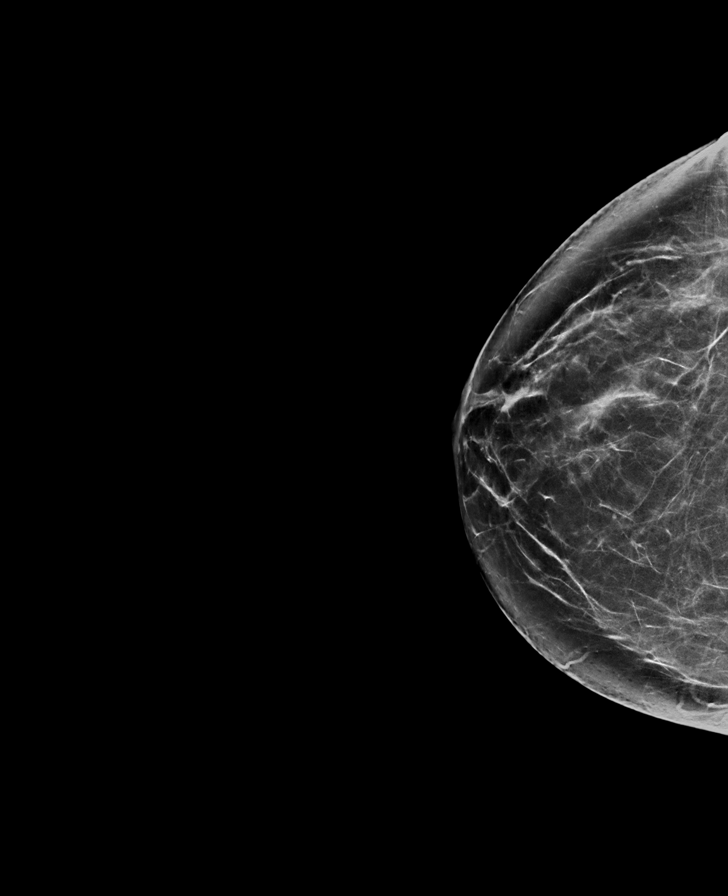

[L MLO synth-2D]
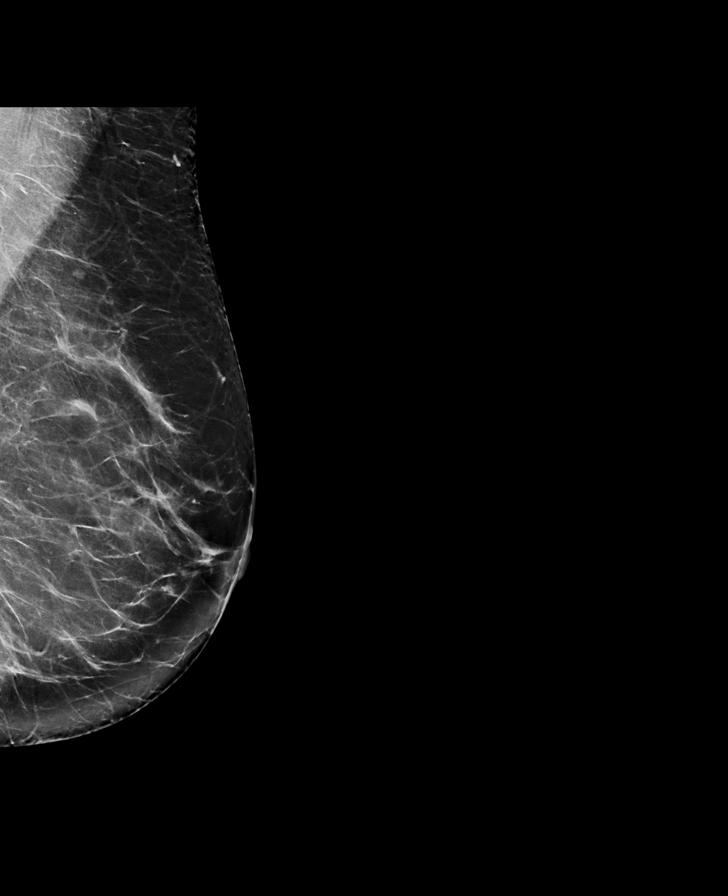

[R CC tomo · tomo slice 41/82.0]
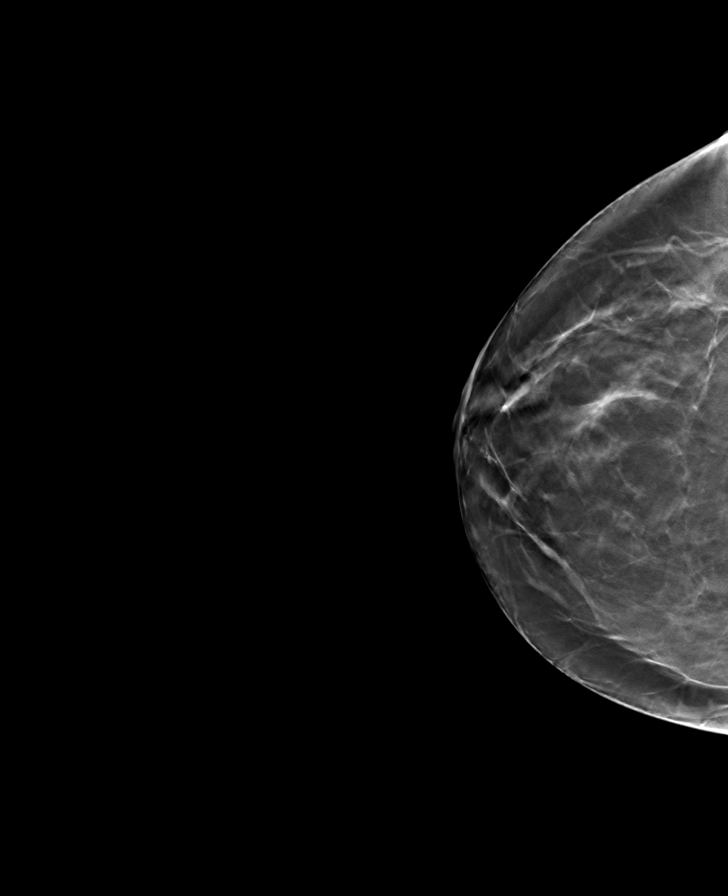

[L CC tomo · tomo slice 45/89.0]
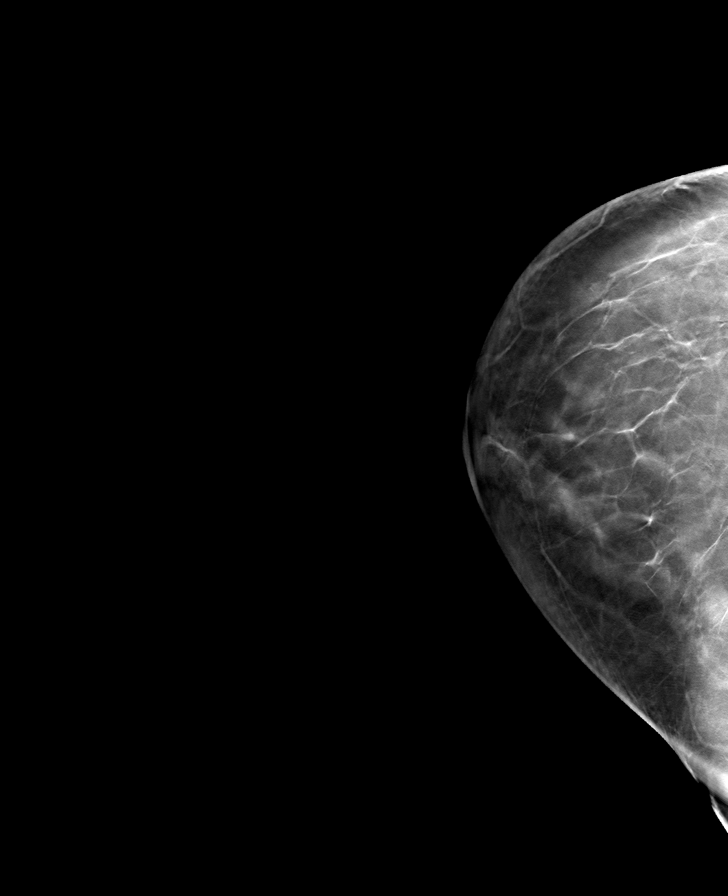

[L MLO tomo · tomo slice 43/84.0]
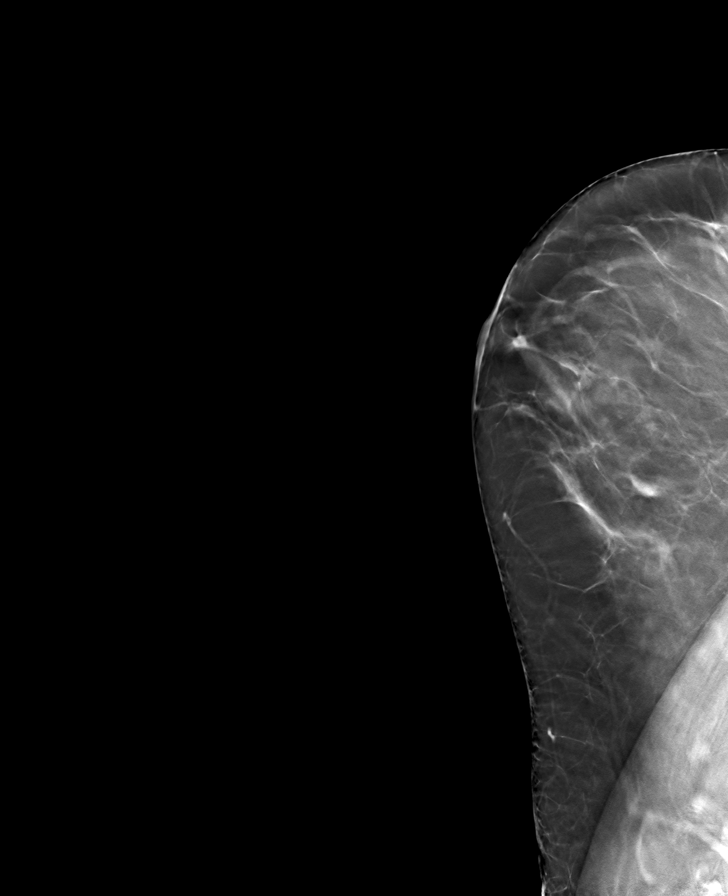

[R MLO tomo · tomo slice 41/82.0]
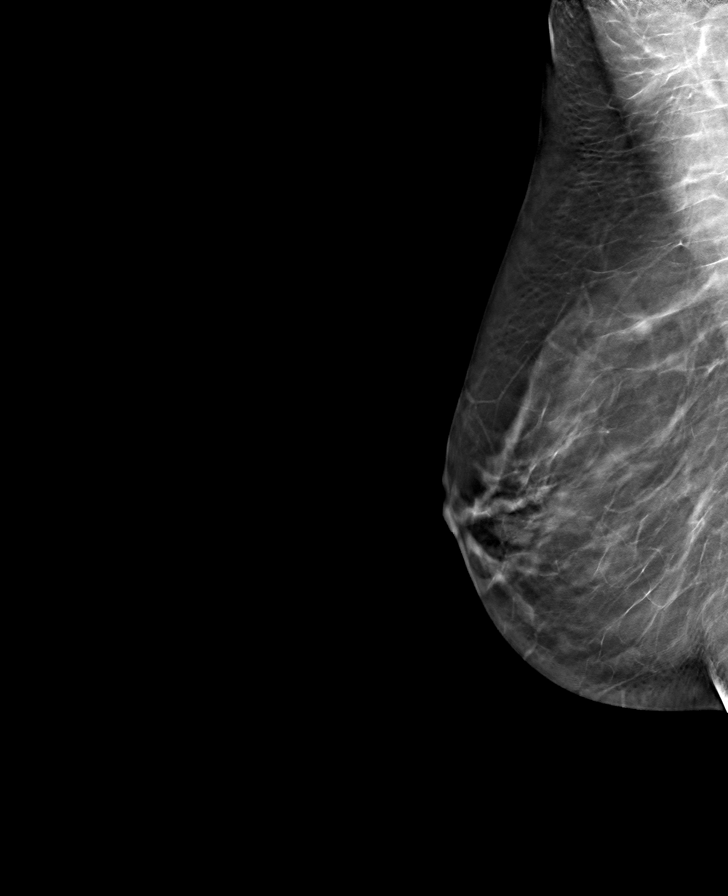

[8 of 24 positions shown; findings below may reference images not displayed]

ACR Breast Density Category b: There are scattered areas of
fibroglandular density.
FINDINGS: There are no findings suspicious for malignancy. The images were
evaluated with computer-aided detection.
IMPRESSION: No mammographic evidence of malignancy. A result letter of this
screening mammogram will be mailed directly to the patient.

RECOMMENDATION:
Screening mammogram in one year. (Code:WJ-I-BG6)

BI-RADS CATEGORY  1: Negative.

## 2023-06-11 ENCOUNTER — Other Ambulatory Visit: Payer: Self-pay | Admitting: Obstetrics and Gynecology

## 2023-06-11 DIAGNOSIS — Z1501 Genetic susceptibility to malignant neoplasm of breast: Secondary | ICD-10-CM

## 2023-10-03 ENCOUNTER — Other Ambulatory Visit: Payer: Self-pay | Admitting: Obstetrics and Gynecology

## 2023-10-03 DIAGNOSIS — Z1231 Encounter for screening mammogram for malignant neoplasm of breast: Secondary | ICD-10-CM

## 2023-10-10 ENCOUNTER — Ambulatory Visit
Admission: RE | Admit: 2023-10-10 | Discharge: 2023-10-10 | Disposition: A | Discharge status: Home / Self Care | Payer: 59 | Source: Ambulatory Visit | Attending: Obstetrics and Gynecology | Admitting: Obstetrics and Gynecology

## 2023-10-10 DIAGNOSIS — Z1231 Encounter for screening mammogram for malignant neoplasm of breast: Secondary | ICD-10-CM | POA: Insufficient documentation

## 2023-11-29 ENCOUNTER — Ambulatory Visit
Admission: RE | Admit: 2023-11-29 | Discharge: 2023-11-29 | Disposition: A | Discharge status: Home / Self Care | Payer: 59 | Source: Ambulatory Visit | Attending: Obstetrics and Gynecology | Admitting: Obstetrics and Gynecology

## 2023-11-29 DIAGNOSIS — Z1501 Genetic susceptibility to malignant neoplasm of breast: Secondary | ICD-10-CM

## 2023-11-29 MED ORDER — GADOPICLENOL 0.5 MMOL/ML IV SOLN
7.0000 mL | Freq: Once | INTRAVENOUS | Status: AC | PRN
  Administered 2023-11-29: 7 mL via INTRAVENOUS

## 2023-12-03 ENCOUNTER — Other Ambulatory Visit: Payer: Self-pay | Admitting: Obstetrics and Gynecology

## 2023-12-03 DIAGNOSIS — R928 Other abnormal and inconclusive findings on diagnostic imaging of breast: Secondary | ICD-10-CM

## 2023-12-05 ENCOUNTER — Ambulatory Visit
Admission: RE | Admit: 2023-12-05 | Discharge: 2023-12-05 | Disposition: A | Discharge status: Home / Self Care | Payer: 59 | Source: Ambulatory Visit | Attending: Obstetrics and Gynecology | Admitting: Obstetrics and Gynecology

## 2023-12-05 DIAGNOSIS — R928 Other abnormal and inconclusive findings on diagnostic imaging of breast: Secondary | ICD-10-CM

## 2023-12-05 MED ORDER — GADOPICLENOL 0.5 MMOL/ML IV SOLN
6.0000 mL | Freq: Once | INTRAVENOUS | Status: AC | PRN
  Administered 2023-12-05: 6 mL via INTRAVENOUS

## 2023-12-08 ENCOUNTER — Other Ambulatory Visit: Payer: Self-pay | Admitting: Internal Medicine

## 2023-12-08 DIAGNOSIS — E782 Mixed hyperlipidemia: Secondary | ICD-10-CM

## 2023-12-08 DIAGNOSIS — Z0000 Encounter for general adult medical examination without abnormal findings: Secondary | ICD-10-CM

## 2023-12-08 LAB — SURGICAL PATHOLOGY

## 2023-12-19 ENCOUNTER — Other Ambulatory Visit: Payer: Self-pay

## 2023-12-31 ENCOUNTER — Ambulatory Visit
Admission: RE | Admit: 2023-12-31 | Discharge: 2023-12-31 | Disposition: A | Discharge status: Home / Self Care | Payer: 59 | Source: Ambulatory Visit | Attending: Internal Medicine | Admitting: Internal Medicine

## 2023-12-31 DIAGNOSIS — Z0000 Encounter for general adult medical examination without abnormal findings: Secondary | ICD-10-CM

## 2023-12-31 DIAGNOSIS — E782 Mixed hyperlipidemia: Secondary | ICD-10-CM

## 2024-06-16 ENCOUNTER — Encounter: Payer: Self-pay | Admitting: Internal Medicine

## 2024-06-16 ENCOUNTER — Other Ambulatory Visit: Payer: Self-pay | Admitting: Internal Medicine

## 2024-06-16 DIAGNOSIS — R911 Solitary pulmonary nodule: Secondary | ICD-10-CM

## 2024-06-23 ENCOUNTER — Ambulatory Visit
Admission: RE | Admit: 2024-06-23 | Discharge: 2024-06-23 | Disposition: A | Discharge status: Home / Self Care | Source: Ambulatory Visit | Attending: Internal Medicine | Admitting: Internal Medicine

## 2024-06-23 DIAGNOSIS — R911 Solitary pulmonary nodule: Secondary | ICD-10-CM

## 2024-07-16 ENCOUNTER — Other Ambulatory Visit: Payer: Self-pay | Admitting: Obstetrics and Gynecology

## 2024-07-16 DIAGNOSIS — Z1501 Genetic susceptibility to malignant neoplasm of breast: Secondary | ICD-10-CM

## 2024-07-19 ENCOUNTER — Encounter: Payer: Self-pay | Admitting: Obstetrics and Gynecology

## 2024-07-20 ENCOUNTER — Encounter: Payer: Self-pay | Admitting: Obstetrics and Gynecology

## 2024-07-21 ENCOUNTER — Encounter: Payer: Self-pay | Admitting: Obstetrics and Gynecology

## 2024-07-22 ENCOUNTER — Encounter: Payer: Self-pay | Admitting: Obstetrics and Gynecology

## 2024-07-23 ENCOUNTER — Encounter: Payer: Self-pay | Admitting: Obstetrics and Gynecology

## 2024-07-29 ENCOUNTER — Encounter: Payer: Self-pay | Admitting: Obstetrics and Gynecology

## 2024-07-30 ENCOUNTER — Ambulatory Visit
Admission: RE | Admit: 2024-07-30 | Discharge: 2024-07-30 | Disposition: A | Discharge status: Home / Self Care | Source: Ambulatory Visit | Attending: Obstetrics and Gynecology | Admitting: Obstetrics and Gynecology

## 2024-07-30 DIAGNOSIS — Z1501 Genetic susceptibility to malignant neoplasm of breast: Secondary | ICD-10-CM

## 2024-07-30 MED ORDER — GADOPICLENOL 0.5 MMOL/ML IV SOLN
7.0000 mL | Freq: Once | INTRAVENOUS | Status: AC | PRN
  Administered 2024-07-30: 7 mL via INTRAVENOUS

## 2024-12-06 ENCOUNTER — Other Ambulatory Visit: Payer: Self-pay | Admitting: Obstetrics and Gynecology

## 2024-12-06 DIAGNOSIS — Z1231 Encounter for screening mammogram for malignant neoplasm of breast: Secondary | ICD-10-CM

## 2024-12-09 ENCOUNTER — Inpatient Hospital Stay
Admission: RE | Admit: 2024-12-09 | Discharge: 2024-12-09 | Attending: Obstetrics and Gynecology | Admitting: Obstetrics and Gynecology

## 2024-12-09 DIAGNOSIS — Z1231 Encounter for screening mammogram for malignant neoplasm of breast: Secondary | ICD-10-CM | POA: Insufficient documentation
